# Patient Record
Sex: Female | Born: 1990 | Race: White | Hispanic: No | Marital: Single | Smoking: Never smoker
Health system: Southern US, Community
[De-identification: ages and names within clinical notes are randomized; demographics above are authoritative.]

## PROBLEM LIST (undated history)

## (undated) HISTORY — PX: FRACTURE SURGERY: SHX138

---
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
## 2019-05-14 ENCOUNTER — Emergency Department (HOSPITAL_COMMUNITY)
Admission: EM | Admit: 2019-05-14 | Discharge: 2019-05-14 | Disposition: A | Discharge status: Home / Self Care | Payer: BC Managed Care – PPO | Attending: Emergency Medicine | Admitting: Emergency Medicine

## 2019-05-14 ENCOUNTER — Emergency Department (HOSPITAL_COMMUNITY): Payer: BC Managed Care – PPO

## 2019-05-14 ENCOUNTER — Other Ambulatory Visit: Payer: Self-pay

## 2019-05-14 ENCOUNTER — Encounter (HOSPITAL_COMMUNITY): Payer: Self-pay

## 2019-05-14 DIAGNOSIS — R1031 Right lower quadrant pain: Secondary | ICD-10-CM | POA: Diagnosis present

## 2019-05-14 DIAGNOSIS — N132 Hydronephrosis with renal and ureteral calculous obstruction: Secondary | ICD-10-CM | POA: Insufficient documentation

## 2019-05-14 DIAGNOSIS — N200 Calculus of kidney: Secondary | ICD-10-CM

## 2019-05-14 DIAGNOSIS — R102 Pelvic and perineal pain: Secondary | ICD-10-CM

## 2019-05-14 DIAGNOSIS — Z79899 Other long term (current) drug therapy: Secondary | ICD-10-CM | POA: Insufficient documentation

## 2019-05-14 HISTORY — DX: Hemochromatosis, unspecified: E83.119

## 2019-05-14 LAB — URINALYSIS, ROUTINE W REFLEX MICROSCOPIC
Bilirubin Urine: NEGATIVE
Glucose, UA: NEGATIVE mg/dL
Ketones, ur: 20 mg/dL — AB
Leukocytes,Ua: NEGATIVE
Nitrite: NEGATIVE
Protein, ur: NEGATIVE mg/dL
RBC / HPF: >50 RBC/hpf — ABNORMAL HIGH (ref 0–5)
Specific Gravity, Urine: 1.042 — ABNORMAL HIGH (ref 1.005–1.030)
pH: 5 (ref 5.0–8.0)

## 2019-05-14 LAB — CBC
HCT: 36.8 % (ref 36.0–46.0)
Hemoglobin: 11.9 g/dL — ABNORMAL LOW (ref 12.0–15.0)
MCH: 30.1 pg (ref 26.0–34.0)
MCHC: 32.3 g/dL (ref 30.0–36.0)
MCV: 92.9 fL (ref 80.0–100.0)
Platelets: 278 10*3/uL (ref 150–400)
RBC: 3.96 MIL/uL (ref 3.87–5.11)
RDW: 11.7 % (ref 11.5–15.5)
WBC: 13.3 10*3/uL — ABNORMAL HIGH (ref 4.0–10.5)
nRBC: 0 % (ref 0.0–0.2)

## 2019-05-14 LAB — COMPREHENSIVE METABOLIC PANEL
ALT: 16 U/L (ref 0–44)
AST: 30 U/L (ref 15–41)
Albumin: 4.3 g/dL (ref 3.5–5.0)
Alkaline Phosphatase: 55 U/L (ref 38–126)
Anion gap: 10 (ref 5–15)
BUN: 18 mg/dL (ref 6–20)
CO2: 24 mmol/L (ref 22–32)
Calcium: 9.4 mg/dL (ref 8.9–10.3)
Chloride: 104 mmol/L (ref 98–111)
Creatinine, Ser: 0.76 mg/dL (ref 0.44–1.00)
GFR calc Af Amer: >60 mL/min (ref >60)
GFR calc non Af Amer: >60 mL/min (ref >60)
Glucose, Bld: 106 mg/dL — ABNORMAL HIGH (ref 70–99)
Potassium: 3.6 mmol/L (ref 3.5–5.1)
Sodium: 138 mmol/L (ref 135–145)
Total Bilirubin: 0.6 mg/dL (ref 0.3–1.2)
Total Protein: 7.8 g/dL (ref 6.5–8.1)

## 2019-05-14 LAB — I-STAT BETA HCG BLOOD, ED (MC, WL, AP ONLY): I-stat hCG, quantitative: <5 m[IU]/mL (ref <5)

## 2019-05-14 LAB — LIPASE, BLOOD: Lipase: 25 U/L (ref 11–51)

## 2019-05-14 MED ORDER — SODIUM CHLORIDE 0.9 % IV BOLUS
1000.0000 mL | Freq: Once | INTRAVENOUS | Status: AC
  Administered 2019-05-14: 1000 mL via INTRAVENOUS

## 2019-05-14 MED ORDER — MORPHINE SULFATE (PF) 4 MG/ML IV SOLN
4.0000 mg | Freq: Once | INTRAVENOUS | Status: AC
  Administered 2019-05-14: 4 mg via INTRAVENOUS
  Filled 2019-05-14: qty 1

## 2019-05-14 MED ORDER — HYDROMORPHONE HCL 1 MG/ML IJ SOLN
1.0000 mg | Freq: Once | INTRAMUSCULAR | Status: AC
Start: 2019-05-14 — End: 2019-05-14
  Administered 2019-05-14: 16:00:00 1 mg via INTRAVENOUS
  Filled 2019-05-14: qty 1

## 2019-05-14 MED ORDER — IOHEXOL 300 MG/ML  SOLN
100.0000 mL | Freq: Once | INTRAMUSCULAR | Status: AC | PRN
  Administered 2019-05-14: 15:00:00 100 mL via INTRAVENOUS

## 2019-05-14 MED ORDER — ONDANSETRON HCL 4 MG/2ML IJ SOLN
4.0000 mg | Freq: Once | INTRAMUSCULAR | Status: AC
  Administered 2019-05-14: 13:00:00 4 mg via INTRAVENOUS
  Filled 2019-05-14: qty 2

## 2019-05-14 MED ORDER — ONDANSETRON HCL 4 MG PO TABS: 4.0000 mg | ORAL_TABLET | Freq: Three times a day (TID) | ORAL | 0 refills | Status: AC | PRN

## 2019-05-14 MED ORDER — HYDROMORPHONE HCL 1 MG/ML IJ SOLN
1.0000 mg | Freq: Once | INTRAMUSCULAR | Status: AC
  Administered 2019-05-14: 14:00:00 1 mg via INTRAVENOUS
  Filled 2019-05-14: qty 1

## 2019-05-14 MED ORDER — ONDANSETRON HCL 4 MG/2ML IJ SOLN
4.0000 mg | Freq: Once | INTRAMUSCULAR | Status: AC
  Administered 2019-05-14: 16:00:00 4 mg via INTRAVENOUS
  Filled 2019-05-14: qty 2

## 2019-05-14 MED ORDER — SODIUM CHLORIDE (PF) 0.9 % IJ SOLN
INTRAMUSCULAR | Status: AC
  Filled 2019-05-14: qty 50

## 2019-05-14 MED ORDER — IBUPROFEN 600 MG PO TABS: 600.0000 mg | ORAL_TABLET | Freq: Four times a day (QID) | ORAL | 0 refills | Status: AC | PRN

## 2019-05-14 MED ORDER — TAMSULOSIN HCL 0.4 MG PO CAPS: 0.4000 mg | ORAL_CAPSULE | Freq: Every day | ORAL | 0 refills | Status: AC

## 2019-05-14 MED ORDER — SODIUM CHLORIDE 0.9% FLUSH: 3.0000 mL | Freq: Once | INTRAVENOUS | Status: DC

## 2019-05-14 NOTE — ED Notes (Signed)
Pt ambulated to restroom. 

## 2019-05-14 NOTE — ED Provider Notes (Signed)
Received signout at the beginning of shift previous provider, please see her note complete H&P.  In short, 29 year old female presenting with onset of right lower quadrant abdominal pain.  Initial concern for ovarian torsion versus appendicitis.  Initial ultrasound is negative.  CT of abdomen pelvis obtained and showed no evidence of appendicitis however patient does have a 3 mm kidney stones at the right UVJ causing mild to moderate right hydronephrosis.  There are additional punctated right renal calculi.  Currently the pain has improved.  UA is currently pending.  4:47 PM UA with large amount of improvement in urine dipsticks consistent with patient presenting concern.  No obvious signs of infection.  At this time pain has improved therefore patient stable for discharge with outpatient follow-up.  Return precaution given  BP 121/87 (BP Location: Left Arm)   Pulse 62   Temp 98.2 F (36.8 C) (Oral)   Resp 14   Ht 5\' 5"  (1.651 m)   Wt 56.7 kg   SpO2 100%   BMI 20.80 kg/m   Results for orders placed or performed during the hospital encounter of 05/14/19  Lipase, blood  Result Value Ref Range   Lipase 25 11 - 51 U/L  Comprehensive metabolic panel  Result Value Ref Range   Sodium 138 135 - 145 mmol/L   Potassium 3.6 3.5 - 5.1 mmol/L   Chloride 104 98 - 111 mmol/L   CO2 24 22 - 32 mmol/L   Glucose, Bld 106 (H) 70 - 99 mg/dL   BUN 18 6 - 20 mg/dL   Creatinine, Ser 05/16/19 0.44 - 1.00 mg/dL   Calcium 9.4 8.9 - 2.72 mg/dL   Total Protein 7.8 6.5 - 8.1 g/dL   Albumin 4.3 3.5 - 5.0 g/dL   AST 30 15 - 41 U/L   ALT 16 0 - 44 U/L   Alkaline Phosphatase 55 38 - 126 U/L   Total Bilirubin 0.6 0.3 - 1.2 mg/dL   GFR calc non Af Amer >60 >60 mL/min   GFR calc Af Amer >60 >60 mL/min   Anion gap 10 5 - 15  CBC  Result Value Ref Range   WBC 13.3 (H) 4.0 - 10.5 K/uL   RBC 3.96 3.87 - 5.11 MIL/uL   Hemoglobin 11.9 (L) 12.0 - 15.0 g/dL   HCT 53.6 64.4 - 03.4 %   MCV 92.9 80.0 - 100.0 fL   MCH  30.1 26.0 - 34.0 pg   MCHC 32.3 30.0 - 36.0 g/dL   RDW 74.2 59.5 - 63.8 %   Platelets 278 150 - 400 K/uL   nRBC 0.0 0.0 - 0.2 %  Urinalysis, Routine w reflex microscopic  Result Value Ref Range   Color, Urine YELLOW YELLOW   APPearance CLEAR CLEAR   Specific Gravity, Urine 1.042 (H) 1.005 - 1.030   pH 5.0 5.0 - 8.0   Glucose, UA NEGATIVE NEGATIVE mg/dL   Hgb urine dipstick LARGE (A) NEGATIVE   Bilirubin Urine NEGATIVE NEGATIVE   Ketones, ur 20 (A) NEGATIVE mg/dL   Protein, ur NEGATIVE NEGATIVE mg/dL   Nitrite NEGATIVE NEGATIVE   Leukocytes,Ua NEGATIVE NEGATIVE   RBC / HPF >50 (H) 0 - 5 RBC/hpf   WBC, UA 6-10 0 - 5 WBC/hpf   Bacteria, UA RARE (A) NONE SEEN   Squamous Epithelial / LPF 0-5 0 - 5   Mucus PRESENT    Hyaline Casts, UA PRESENT   I-Stat beta hCG blood, ED  Result Value Ref Range  I-stat hCG, quantitative <5.0 <5 mIU/mL   Comment 3           CT ABDOMEN PELVIS W CONTRAST  Result Date: 05/14/2019 CLINICAL DATA:  Right lower quadrant abdominal pain for 3 hours EXAM: CT ABDOMEN AND PELVIS WITH CONTRAST TECHNIQUE: Multidetector CT imaging of the abdomen and pelvis was performed using the standard protocol following bolus administration of intravenous contrast. CONTRAST:  153mL OMNIPAQUE IOHEXOL 300 MG/ML  SOLN COMPARISON:  Pelvic ultrasound 05/14/2019 FINDINGS: Lower chest: No acute abnormality. Hepatobiliary: No focal liver abnormality is seen. No gallstones, gallbladder wall thickening, or biliary dilatation. Pancreas: Unremarkable. No pancreatic ductal dilatation or surrounding inflammatory changes. Spleen: Normal in size without focal abnormality. Adrenals/Urinary Tract: Unremarkable adrenal glands. Multiple punctate 2-3 mm calculi within the mid and superior calices of the right kidney (series 5, image 73). 3 mm calculus at the right ureterovesical junction causing mild-to-moderate right-sided hydroureteronephrosis. Slightly delayed right nephrogram. Left kidney is  unremarkable without renal stone or hydronephrosis. The left ureter is unremarkable. Urinary bladder is unremarkable. Stomach/Bowel: Stomach is within normal limits. Air-filled retrocecal appendix without periappendiceal inflammatory changes. No evidence of bowel wall thickening, distention, or inflammatory changes. Vascular/Lymphatic: No significant vascular findings are present. No enlarged abdominal or pelvic lymph nodes. Reproductive: Uterus and bilateral adnexa are unremarkable. Other: Trace free fluid in the pelvis. No abdominal wall hernia. No pneumoperitoneum. Musculoskeletal: No acute or significant osseous findings. IMPRESSION: 1. 3 mm calculus at the right ureterovesical junction causing mild-to-moderate right-sided hydroureteronephrosis. 2. Additional punctate 2-3 mm right renal calculi. 3. Normal appendix. Electronically Signed   By: Davina Poke D.O.   On: 05/14/2019 15:43   US PELVIC COMPLETE W TRANSVAGINAL AND TORSION R/O  Result Date: 05/14/2019 CLINICAL DATA:  RIGHT lower quadrant pain question ovarian torsion EXAM: TRANSABDOMINAL AND TRANSVAGINAL ULTRASOUND OF PELVIS DOPPLER ULTRASOUND OF OVARIES TECHNIQUE: Both transabdominal and transvaginal ultrasound examinations of the pelvis were performed. Transabdominal technique was performed for global imaging of the pelvis including uterus, ovaries, adnexal regions, and pelvic cul-de-sac. It was necessary to proceed with endovaginal exam following the transabdominal exam to visualize the endometrium and ovaries. Color and duplex Doppler ultrasound was utilized to evaluate blood flow to the ovaries. COMPARISON:  None FINDINGS: Uterus Measurements: 7.0 x 2.9 x 3.9 cm = volume: 41 mL. Normal morphology without mass Endometrium Thickness: 3 mm. Tiny amount of nonspecific endometrial fluid at lower uterine segment endometrial canal. No focal mass. Right ovary Measurements: 4.3 x 2.7 x 2.3 cm = volume: 14.1 mL. Numerous small follicles without  dominant mass or cyst. No surrounding edema. Blood flow present within RIGHT ovary on color Doppler imaging. Left ovary Measurements: 2.3 x 3.4 x 2.9 cm = volume: 11.3 mL. Numerous small follicles without dominant mass or cyst. No surrounding edema. Internal blood flow present on color Doppler imaging. Pulsed Doppler evaluation of both ovaries demonstrates normal low-resistance arterial and venous waveforms. Other findings No free pelvic fluid.  No adnexal masses. IMPRESSION: Suspected polycystic ovaries. No evidence of dominant ovarian mass or ovarian torsion. No acute abnormalities. Electronically Signed   By: Lavonia Dana M.D.   On: 05/14/2019 15:02      Domenic Moras, PA-C 05/14/19 [place], Nathan, MD 05/14/19 [phone]

## 2019-05-14 NOTE — ED Provider Notes (Addendum)
[facility]-EMERGENCY DEPT Provider Note   CSN: [id number] Arrival date & time: 05/14/19  1220     History Chief Complaint  Patient presents with  . Abdominal Pain  . Nausea    Cynthia Fischer is a 29 y.o. female with past medical history significant for hemochromatosis who presents for evaluation abdominal pain.  Patient states she has had right lower quadrant abdominal pain since around 7:00 this morning.  Patient states she woke up and felt a "twinge" to her right lower quadrant.  States pain has been worsening throughout the day.  Now described as an aching, sharp and stabbing.  She has not take anything for pain.  She rates her pain a 10/10.  Has had multiple episodes of NBNB emesis.  Denies fever, chills, chest pain, shortness of breath, pelvic pain, vaginal discharge, concerns for STDs.  Denies additional aggravating relieving factors.  No prior abdominal surgeries.  History obtained from patient and past medical records.  No interpreter is used.  HPI     Past Medical History:  Diagnosis Date  . Hemochromatosis     There are no problems to display for this patient.   Past Surgical History:  Procedure Laterality Date  . FRACTURE SURGERY       OB History   No obstetric history on file.     Family History  Problem Relation Age of Onset  . Healthy Mother   . Hypertension Father   . Asthma Father     Social History   Tobacco Use  . Smoking status: Never Smoker  . Smokeless tobacco: Never Used  Substance Use Topics  . Alcohol use: Yes  . Drug use: Never    Home Medications Prior to Admission medications   Medication Sig Start Date End Date Taking? Authorizing Provider  dicyclomine (BENTYL) 10 MG capsule Take 10 mg by mouth daily as needed. 12/25/18  Yes [provider]  etonogestrel (NEXPLANON) 68 MG IMPL implant 1 each by Subdermal route once. 01/25/16  Yes [provider]    Allergies    Amoxicillin  Review of  Systems   Review of Systems  Constitutional: Negative.   HENT: Negative.   Respiratory: Negative.   Cardiovascular: Negative.   Gastrointestinal: Positive for abdominal pain, nausea and vomiting. Negative for abdominal distention, anal bleeding, blood in stool, constipation, diarrhea and rectal pain.  Genitourinary: Negative.   Musculoskeletal: Negative.   Skin: Negative.   Neurological: Negative.   All other systems reviewed and are negative.   Physical Exam Updated Vital Signs BP 121/87 (BP Location: Left Arm)   Pulse 62   Temp 98.2 F (36.8 C) (Oral)   Resp 14   Ht 5\' 5"  (1.651 m)   Wt 56.7 kg   SpO2 100%   BMI 20.80 kg/m   Physical Exam Vitals and nursing note reviewed.  Constitutional:      General: She is not in acute distress.    Appearance: She is well-developed. She is not ill-appearing, toxic-appearing or diaphoretic.  HENT:     Head: Normocephalic and atraumatic.     Mouth/Throat:     Mouth: Mucous membranes are moist.     Pharynx: Oropharynx is clear.  Eyes:     Pupils: Pupils are equal, round, and reactive to light.  Cardiovascular:     Rate and Rhythm: Normal rate.     Heart sounds: Normal heart sounds.  Pulmonary:     Effort: Pulmonary effort is normal. No respiratory distress.  Breath sounds: Normal breath sounds.  Abdominal:     General: Bowel sounds are normal. There is no distension.     Tenderness: There is abdominal tenderness in the right lower quadrant. There is guarding. There is no right CVA tenderness, left CVA tenderness or rebound. Positive signs include McBurney's sign. Negative signs include Murphy's sign and Rovsing's sign.     Hernia: No hernia is present.  Genitourinary:    Comments: Declines at this time Musculoskeletal:        General: Normal range of motion.     Cervical back: Normal range of motion.  Skin:    General: Skin is warm and dry.  Neurological:     Mental Status: She is alert.     ED Results / Procedures /  Treatments   Labs (all labs ordered are listed, but only abnormal results are displayed) Labs Reviewed  COMPREHENSIVE METABOLIC PANEL - Abnormal; Notable for the following components:      Result Value   Glucose, Bld 106 (*)    All other components within normal limits  CBC - Abnormal; Notable for the following components:   WBC 13.3 (*)    Hemoglobin 11.9 (*)    All other components within normal limits  LIPASE, BLOOD  URINALYSIS, ROUTINE W REFLEX MICROSCOPIC  I-STAT BETA HCG BLOOD, ED ([facility], [facility], [facility] ONLY)    EKG None  Radiology US PELVIC COMPLETE W TRANSVAGINAL AND TORSION R/O  Result Date: 05/14/2019 CLINICAL DATA:  RIGHT lower quadrant pain question ovarian torsion EXAM: TRANSABDOMINAL AND TRANSVAGINAL ULTRASOUND OF PELVIS DOPPLER ULTRASOUND OF OVARIES TECHNIQUE: Both transabdominal and transvaginal ultrasound examinations of the pelvis were performed. Transabdominal technique was performed for global imaging of the pelvis including uterus, ovaries, adnexal regions, and pelvic cul-de-sac. It was necessary to proceed with endovaginal exam following the transabdominal exam to visualize the endometrium and ovaries. Color and duplex Doppler ultrasound was utilized to evaluate blood flow to the ovaries. COMPARISON:  None FINDINGS: Uterus Measurements: 7.0 x 2.9 x 3.9 cm = volume: 41 mL. Normal morphology without mass Endometrium Thickness: 3 mm. Tiny amount of nonspecific endometrial fluid at lower uterine segment endometrial canal. No focal mass. Right ovary Measurements: 4.3 x 2.7 x 2.3 cm = volume: 14.1 mL. Numerous small follicles without dominant mass or cyst. No surrounding edema. Blood flow present within RIGHT ovary on color Doppler imaging. Left ovary Measurements: 2.3 x 3.4 x 2.9 cm = volume: 11.3 mL. Numerous small follicles without dominant mass or cyst. No surrounding edema. Internal blood flow present on color Doppler imaging. Pulsed Doppler evaluation of both ovaries demonstrates  normal low-resistance arterial and venous waveforms. Other findings No free pelvic fluid.  No adnexal masses. IMPRESSION: Suspected polycystic ovaries. No evidence of dominant ovarian mass or ovarian torsion. No acute abnormalities. Electronically Signed   By: Ulyses Southward M.D.   On: 05/14/2019 15:02    Procedures Procedures (including critical care time)  Medications Ordered in ED Medications  sodium chloride 0.9 % bolus 1,000 mL (1,000 mLs Intravenous Bolus 05/14/19 1315)  morphine 4 MG/ML injection 4 mg (4 mg Intravenous Given 05/14/19 1315)  ondansetron (ZOFRAN) injection 4 mg (4 mg Intravenous Given 05/14/19 1314)  HYDROmorphone (DILAUDID) injection 1 mg (1 mg Intravenous Given 05/14/19 1344)   ED Course  I have reviewed the triage vital signs and the nursing notes.  Pertinent labs & imaging results that were available during my care of the patient were reviewed by me and considered in my medical  decision making (see chart for details).  29 year old female appears otherwise well presents for evaluation of sudden onset right lower quadrant pain with associated NBNB emesis.  Afebrile, nonseptic, not ill-appearing.  Tenderness to right lower quadrant.  She is not concerned about STDs and declines pelvic exam.  Patient with severe pain on exam with some guarding.  Unable to perform obturator or psoas due to patient pain.  Clinical concern for ovarian torsion versus appendicitis.  Will order labs, imaging.  Pain management and fluids  CBC with mild leukocytosis at 13.3 Lipase 25 Metabolic panel without electrolyte, renal abnormality Pregnancy test negative  Contacted ultrasound due to clinical suspicion.  They are on their way for ultrasound.  [place] negative for torsion. Radiology notes ovarian cysts. Pending CT AP to r/o Appendicitis.  Care transferred to [facility] who will follow up on images, UA and determine disposition.    MDM Rules/Calculators/A&P                       Final  Clinical Impression(s) / ED Diagnoses Final diagnoses:  RLQ abdominal pain    Rx / DC Orders ED Discharge Orders    None       Aarohi Redditt A, PA-C 05/14/19 1509    Caidyn Henricksen A, PA-C 05/14/19 1526    Malvin Johns, MD 05/19/19 1515

## 2019-05-14 NOTE — ED Notes (Signed)
Pt requesting additional pain medication. PA aware.  

## 2019-05-14 NOTE — ED Triage Notes (Signed)
Patient c/o nausea and RLQ abdominal pain x 3 hours.

## 2019-05-14 NOTE — Discharge Instructions (Addendum)
Your pain is due to a kidney stone.  You have a 5mm stone in your right ureter near the bladder.  You will likely able to pass this stone in the next several days.  You also have several similar size stones in the right kidney.  Take medications prescribed.  Use a urine strainer to capture the stone and have a urologist to analyze it.  Return if you have any concerns.

## 2021-01-07 IMAGING — CT CT ABD-PELV W/ CM
2 of 4 series · 15 of 46 positions shown, 17 images · IV contrast (omnipaque)
Comparison: Pelvic ultrasound 05/14/2019

CLINICAL DATA: Right lower quadrant abdominal pain for 3 hours

EXAM:
CT ABDOMEN AND PELVIS WITH CONTRAST
TECHNIQUE: Multidetector CT imaging of the abdomen and pelvis was performed
using the standard protocol following bolus administration of
intravenous contrast.
CONTRAST:  100mL OMNIPAQUE IOHEXOL 300 MG/ML  SOLN

[Series 2: axial st · axial · 0.61mm/px · z∈[-467,-67]mm · 12 of 90 slices shown, 14 images]
[im 5/90  soft-tissue]
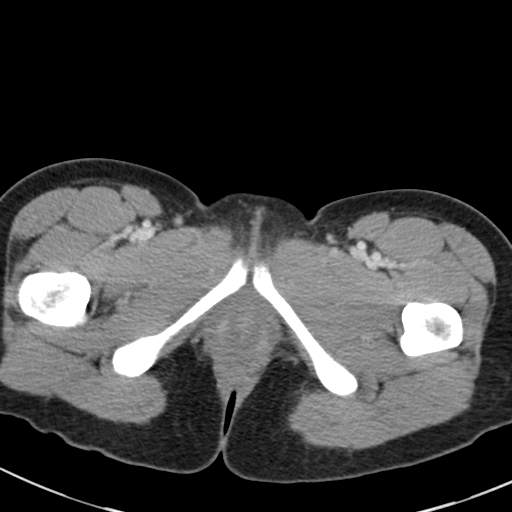
[im 5/90  bone]
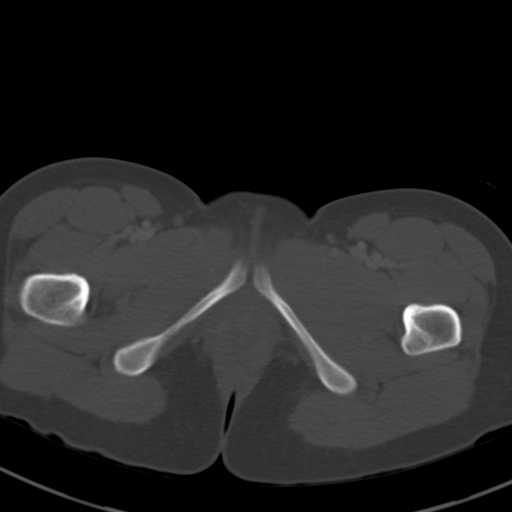
[im 15/90  soft-tissue]
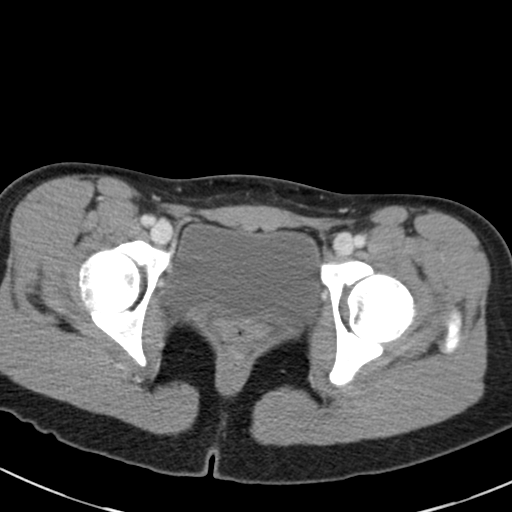
[im 20/90  soft-tissue]
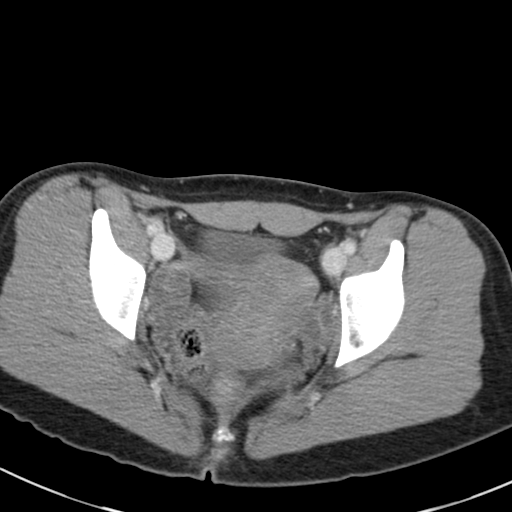
[im 25/90  soft-tissue]
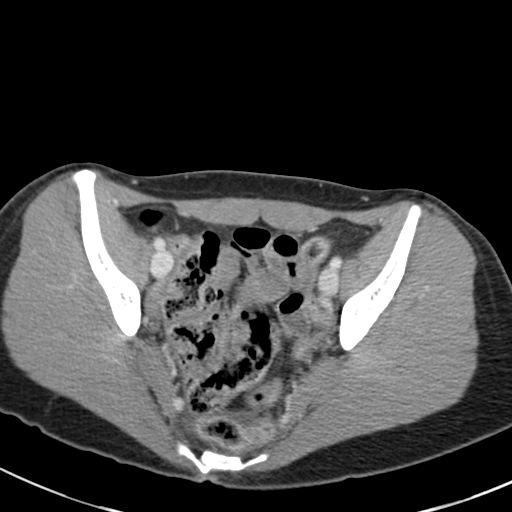
[im 35/90  soft-tissue]
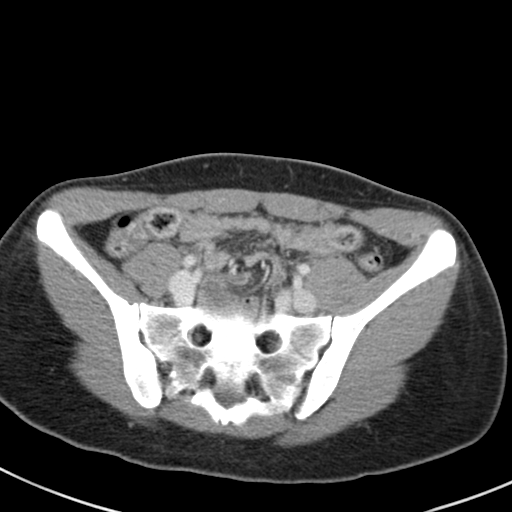
[im 40/90  soft-tissue]
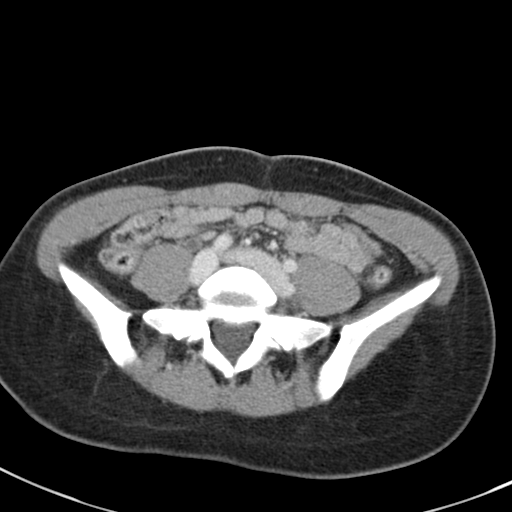
[im 50/90  soft-tissue]
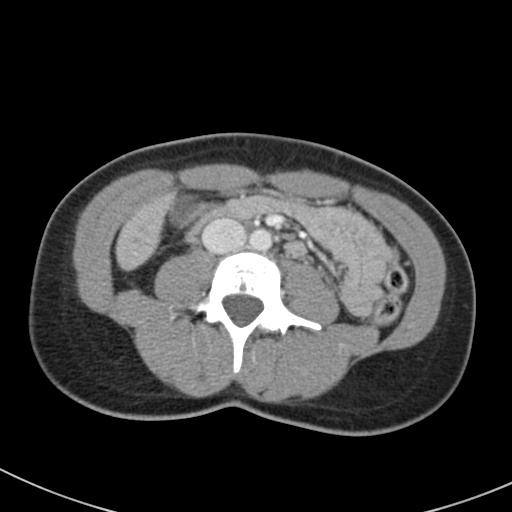
[im 55/90  soft-tissue]
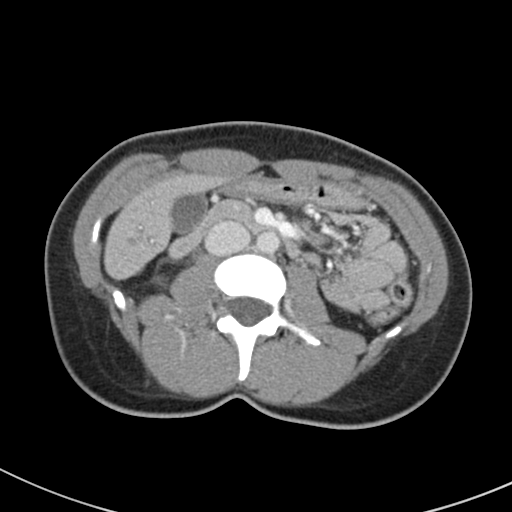
[im 65/90  soft-tissue]
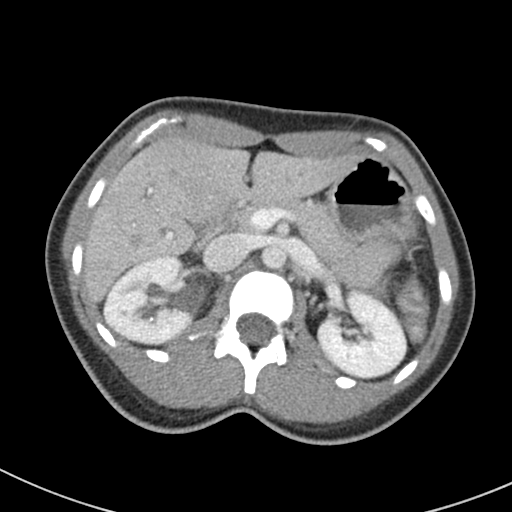
[im 65/90  bone]
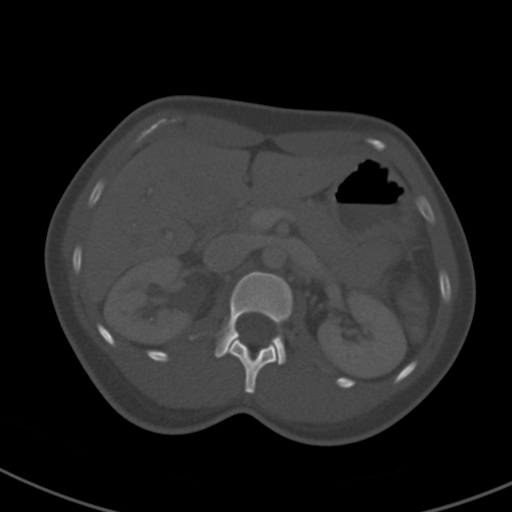
[im 70/90  soft-tissue]
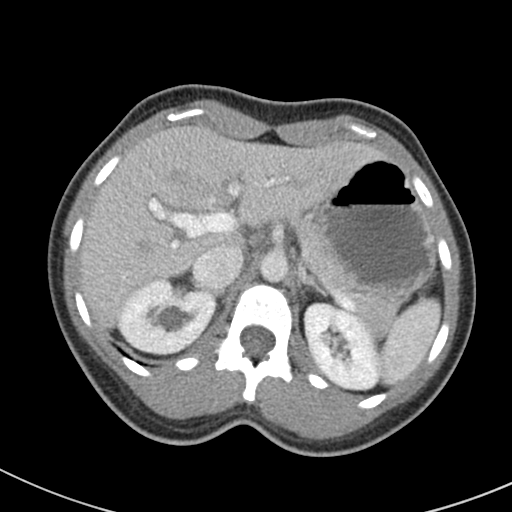
[im 75/90  soft-tissue]
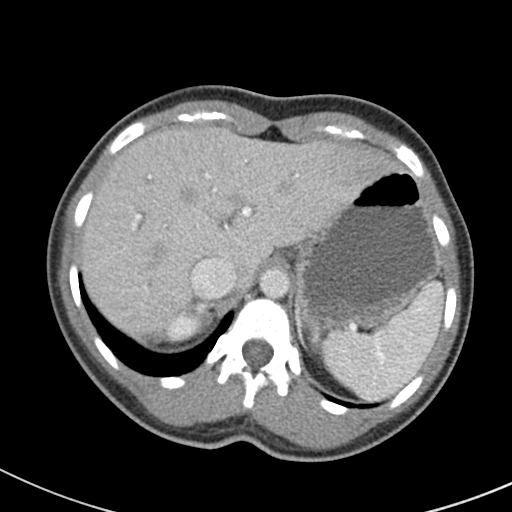
[im 85/90  soft-tissue]
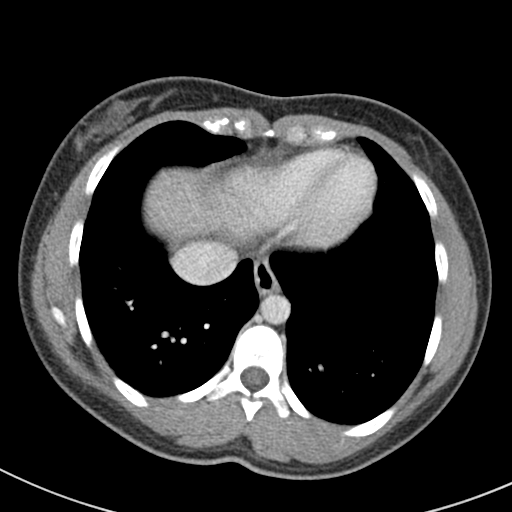

[Series 5: coronal st · coronal · 0.62mm/px · 3 of 123 slices shown]
[im 41/123  soft-tissue]
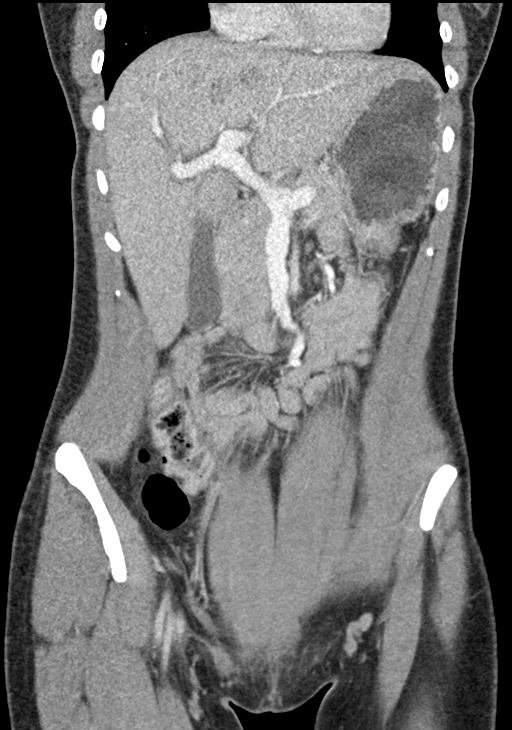
[im 55/123  soft-tissue]
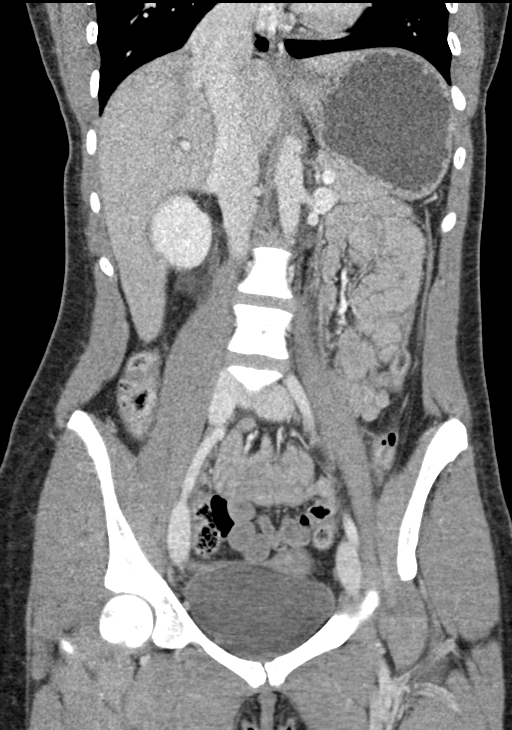
[im 68/123  soft-tissue]
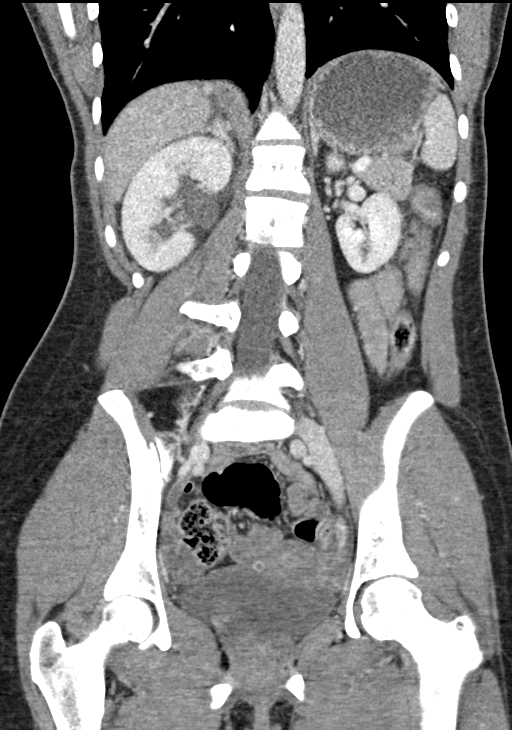

[15 of 46 positions shown; findings below may reference images not displayed]

FINDINGS: Lower chest: No acute abnormality.

Hepatobiliary: No focal liver abnormality is seen. No gallstones,
gallbladder wall thickening, or biliary dilatation.

Pancreas: Unremarkable. No pancreatic ductal dilatation or
surrounding inflammatory changes.

Spleen: Normal in size without focal abnormality.

Adrenals/Urinary Tract: Unremarkable adrenal glands. Multiple
punctate 2-3 mm calculi within the mid and superior calices of the
right kidney (series 5, image 73). 3 mm calculus at the right
ureterovesical junction causing mild-to-moderate right-sided
hydroureteronephrosis. Slightly delayed right nephrogram. Left
kidney is unremarkable without renal stone or hydronephrosis. The
left ureter is unremarkable. Urinary bladder is unremarkable.

Stomach/Bowel: Stomach is within normal limits. Air-filled
retrocecal appendix without periappendiceal inflammatory changes. No
evidence of bowel wall thickening, distention, or inflammatory
changes.

Vascular/Lymphatic: No significant vascular findings are present. No
enlarged abdominal or pelvic lymph nodes.

Reproductive: Uterus and bilateral adnexa are unremarkable.

Other: Trace free fluid in the pelvis. No abdominal wall hernia. No
pneumoperitoneum.

Musculoskeletal: No acute or significant osseous findings.
IMPRESSION: 1. 3 mm calculus at the right ureterovesical junction causing
mild-to-moderate right-sided hydroureteronephrosis.
2. Additional punctate 2-3 mm right renal calculi.
3. Normal appendix.
